# Patient Record
Sex: Male | Born: 1960 | Hispanic: Yes | Marital: Single | Smoking: Never smoker
Health system: Southern US, Community
[De-identification: ages and names within clinical notes are randomized; demographics above are authoritative.]

---

## 2012-03-06 ENCOUNTER — Emergency Department: Payer: Self-pay | Admitting: Emergency Medicine

## 2012-03-06 LAB — URINALYSIS, COMPLETE
Bilirubin,UR: NEGATIVE
Glucose,UR: NEGATIVE mg/dL (ref 0–75)
Ketone: NEGATIVE
Leukocyte Esterase: NEGATIVE
Ph: 5 (ref 4.5–8.0)
Protein: NEGATIVE
Specific Gravity: 1.02 (ref 1.003–1.030)
Squamous Epithelial: <1
WBC UR: <1 /HPF (ref 0–5)

## 2012-03-18 ENCOUNTER — Emergency Department: Payer: Self-pay | Admitting: Emergency Medicine

## 2020-06-03 ENCOUNTER — Encounter: Payer: Self-pay | Admitting: Emergency Medicine

## 2020-06-03 ENCOUNTER — Other Ambulatory Visit: Payer: Self-pay

## 2020-06-03 ENCOUNTER — Emergency Department
Admission: EM | Admit: 2020-06-03 | Discharge: 2020-06-03 | Disposition: A | Discharge status: Home / Self Care | Payer: Worker's Compensation | Attending: Emergency Medicine | Admitting: Emergency Medicine

## 2020-06-03 ENCOUNTER — Emergency Department: Payer: Worker's Compensation

## 2020-06-03 DIAGNOSIS — Z23 Encounter for immunization: Secondary | ICD-10-CM | POA: Insufficient documentation

## 2020-06-03 DIAGNOSIS — S80811A Abrasion, right lower leg, initial encounter: Secondary | ICD-10-CM | POA: Insufficient documentation

## 2020-06-03 DIAGNOSIS — M25561 Pain in right knee: Secondary | ICD-10-CM | POA: Insufficient documentation

## 2020-06-03 DIAGNOSIS — Y990 Civilian activity done for income or pay: Secondary | ICD-10-CM | POA: Insufficient documentation

## 2020-06-03 DIAGNOSIS — Y939 Activity, unspecified: Secondary | ICD-10-CM | POA: Insufficient documentation

## 2020-06-03 DIAGNOSIS — Y929 Unspecified place or not applicable: Secondary | ICD-10-CM | POA: Insufficient documentation

## 2020-06-03 DIAGNOSIS — W228XXA Striking against or struck by other objects, initial encounter: Secondary | ICD-10-CM | POA: Insufficient documentation

## 2020-06-03 DIAGNOSIS — T07XXXA Unspecified multiple injuries, initial encounter: Secondary | ICD-10-CM

## 2020-06-03 MED ORDER — MELOXICAM 15 MG PO TABS: 15.0000 mg | ORAL_TABLET | Freq: Every day | ORAL | 0 refills | Status: AC

## 2020-06-03 MED ORDER — TETANUS-DIPHTH-ACELL PERTUSSIS 5-2.5-18.5 LF-MCG/0.5 IM SUSY
0.5000 mL | PREFILLED_SYRINGE | Freq: Once | INTRAMUSCULAR | Status: AC
  Administered 2020-06-03: 0.5 mL via INTRAMUSCULAR
  Filled 2020-06-03: qty 0.5

## 2020-06-03 NOTE — ED Triage Notes (Addendum)
Triage assessment per face to face spanish interpreter Marchelle Folks.  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  Bleeding controlled at this time, laceration noted to right lower leg below knee. No bandage present at this time.  Pt works for [employer]. Supervisor is Wylene Men [phone]  Unknown when last tetanus shot was.

## 2020-06-03 NOTE — ED Notes (Signed)
Spanish interpreter [phone] utilized to review discharge instructions.

## 2020-06-03 NOTE — ED Provider Notes (Signed)
[facility]-EMERGENCY DEPARTMENT  ____________________________________________  Time seen: Approximately 10:08 PM  I have reviewed the triage vital signs and the nursing notes.   HISTORY  Chief Complaint Leg Injury (Work-related)   Historian Patient     HPI Stephen Rubio is a 60 y.o. male presents to the emergency department with deep abrasions along the right lower extremity along the inferior aspect of the right knee.  Patient reports that a door opened up against his knee while working.  No numbness or tingling in the bilateral lower extremities.  No similar symptoms in the past.   History reviewed. No pertinent past medical history.   Immunizations up to date:  Yes.     History reviewed. No pertinent past medical history.  There are no problems to display for this patient.   History reviewed. No pertinent surgical history.  Prior to Admission medications   Medication Sig Start Date End Date Taking? Authorizing Provider  meloxicam (MOBIC) 15 MG tablet Take 1 tablet (15 mg total) by mouth daily. 06/03/20 07/03/20 Yes Orvil Feil, PA-C    Allergies Patient has no known allergies.  History reviewed. No pertinent family history.  Social History Social History   Tobacco Use  . Smoking status: Never Smoker  . Smokeless tobacco: Never Used  Vaping Use  . Vaping Use: Never used  Substance Use Topics  . Alcohol use: Yes     Review of Systems  Constitutional: No fever/chills Eyes:  No discharge ENT: No upper respiratory complaints. Respiratory: no cough. No SOB/ use of accessory muscles to breath Gastrointestinal:   No nausea, no vomiting.  No diarrhea.  No constipation. Musculoskeletal: Patient has right knee pain.  Skin: Negative for rash, abrasions, lacerations, ecchymosis.    ____________________________________________   PHYSICAL EXAM:  VITAL SIGNS: ED Triage Vitals  Enc Vitals Group     BP 06/03/20 2055 111/73     Pulse Rate 06/03/20  2055 64     Resp 06/03/20 2055 16     Temp 06/03/20 2055 98.5 F (36.9 C)     Temp Source 06/03/20 2055 Oral     SpO2 06/03/20 2055 94 %     Weight 06/03/20 2103 164 lb (74.4 kg)     Height 06/03/20 2103 5' 4.96" (1.65 m)     Head Circumference --      Peak Flow --      Pain Score 06/03/20 2103 10     Pain Loc --      Pain Edu? --      Excl. in GC? --      Constitutional: Alert and oriented. Well appearing and in no acute distress. Eyes: Conjunctivae are normal. PERRL. EOMI. Head: Atraumatic. ENT: Cardiovascular: Normal rate, regular rhythm. Normal S1 and S2.  Good peripheral circulation. Respiratory: Normal respiratory effort without tachypnea or retractions. Lungs CTAB. Good air entry to the bases with no decreased or absent breath sounds Gastrointestinal: Bowel sounds x 4 quadrants. Soft and nontender to palpation. No guarding or rigidity. No distention. Musculoskeletal: Full range of motion to all extremities. No obvious deformities noted Neurologic:  Normal for age. No gross focal neurologic deficits are appreciated.  Skin: Patient has deep abrasions along the anterior aspect of the right lower extremity along the inferior aspect of the right knee. Psychiatric: Mood and affect are normal for age. Speech and behavior are normal.   ____________________________________________   LABS (all labs ordered are listed, but only abnormal results are displayed)  Labs Reviewed - No  data to display ____________________________________________  EKG   ____________________________________________  RADIOLOGY Geraldo Pitter, personally viewed and evaluated these images (plain radiographs) as part of my medical decision making, as well as reviewing the written report by the radiologist.    DG Knee Complete 4 Views Right  Result Date: 06/03/2020 CLINICAL DATA:  Right knee pain, injury EXAM: RIGHT KNEE - COMPLETE 4+ VIEW COMPARISON:  None. FINDINGS: Frontal, bilateral oblique,  lateral views of the right knee are obtained. No fracture, subluxation, or dislocation. Joint spaces are well preserved. Subcutaneous edema within the anteromedial aspect of the right knee. No joint effusion. IMPRESSION: 1. Soft tissue edema anteromedial right knee. 2. No acute fracture. Electronically Signed   By: Sharlet Salina M.D.   On: 06/03/2020 22:37    ____________________________________________    PROCEDURES  Procedure(s) performed:     Procedures     Medications  Tdap (BOOSTRIX) injection 0.5 mL (0.5 mLs Intramuscular Given 06/03/20 2216)     ____________________________________________   INITIAL IMPRESSION / ASSESSMENT AND PLAN / ED COURSE  Pertinent labs & imaging results that were available during my care of the patient were reviewed by me and considered in my medical decision making (see chart for details).      Assessment and Plan: Knee pain: Abrasions  60-year-old male presents to the emergency department with knee pain and deep abrasions along the inferior aspect of the right knee.  No bony abnormality was visualized on x-ray.  Patient's wounds were irrigated and dressed in the emergency department.  He was discharged with meloxicam for pain and inflammation.  He was advised to follow-up with orthopedics as needed.  All patient questions were answered.    ____________________________________________  FINAL CLINICAL IMPRESSION(S) / ED DIAGNOSES  Final diagnoses:  Abrasions of multiple sites  Acute pain of right knee      NEW MEDICATIONS STARTED DURING THIS VISIT:  ED Discharge Orders         Ordered    meloxicam (MOBIC) 15 MG tablet  Daily        06/03/20 2305              This chart was dictated using voice recognition software/Dragon. Despite best efforts to proofread, errors can occur which can change the meaning. Any change was purely unintentional.     Orvil Feil, PA-C 06/04/20 0008    Minna Antis, MD 06/07/20  [phone]

## 2020-06-03 NOTE — Discharge Instructions (Signed)
Take meloxicam once daily for pain and inflammation. 

## 2020-06-03 NOTE — ED Provider Notes (Incomplete)
[facility]-EMERGENCY DEPARTMENT  ____________________________________________  Time seen: Approximately 10:08 PM  I have reviewed the triage vital signs and the nursing notes.   HISTORY  Chief Complaint Leg Injury (Work-related)   Historian Patient     HPI Stephen Rubio is a 60 y.o. male presents to the emergency department with deep abrasions along the right lower extremity along the inferior aspect of the right knee.  Patient reports that a door opened up against his knee while working.  No numbness or tingling in the bilateral lower extremities.  No similar symptoms in the past.   History reviewed. No pertinent past medical history.   Immunizations up to date:  Yes.     History reviewed. No pertinent past medical history.  There are no problems to display for this patient.   History reviewed. No pertinent surgical history.  Prior to Admission medications   Not on File    Allergies Patient has no known allergies.  History reviewed. No pertinent family history.  Social History Social History   Tobacco Use  . Smoking status: Never Smoker  . Smokeless tobacco: Never Used  Vaping Use  . Vaping Use: Never used  Substance Use Topics  . Alcohol use: Yes     Review of Systems  Constitutional: No fever/chills Eyes:  No discharge ENT: No upper respiratory complaints. Respiratory: no cough. No SOB/ use of accessory muscles to breath Gastrointestinal:   No nausea, no vomiting.  No diarrhea.  No constipation. Musculoskeletal: Patient has right knee pain.  Skin: Negative for rash, abrasions, lacerations, ecchymosis.    ____________________________________________   PHYSICAL EXAM:  VITAL SIGNS: ED Triage Vitals  Enc Vitals Group     BP 06/03/20 2055 111/73     Pulse Rate 06/03/20 2055 64     Resp 06/03/20 2055 16     Temp 06/03/20 2055 98.5 F (36.9 C)     Temp Source 06/03/20 2055 Oral     SpO2 06/03/20 2055 94 %     Weight 06/03/20 2103  164 lb (74.4 kg)     Height 06/03/20 2103 5' 4.96" (1.65 m)     Head Circumference --      Peak Flow --      Pain Score 06/03/20 2103 10     Pain Loc --      Pain Edu? --      Excl. in GC? --      Constitutional: Alert and oriented. Well appearing and in no acute distress. Eyes: Conjunctivae are normal. PERRL. EOMI. Head: Atraumatic. ENT: Cardiovascular: Normal rate, regular rhythm. Normal S1 and S2.  Good peripheral circulation. Respiratory: Normal respiratory effort without tachypnea or retractions. Lungs CTAB. Good air entry to the bases with no decreased or absent breath sounds Gastrointestinal: Bowel sounds x 4 quadrants. Soft and nontender to palpation. No guarding or rigidity. No distention. Musculoskeletal: Full range of motion to all extremities. No obvious deformities noted Neurologic:  Normal for age. No gross focal neurologic deficits are appreciated.  Skin: Patient has deep abrasions along the anterior aspect of the right lower extremity along the inferior aspect of the right knee. Psychiatric: Mood and affect are normal for age. Speech and behavior are normal.   ____________________________________________   LABS (all labs ordered are listed, but only abnormal results are displayed)  Labs Reviewed - No data to display ____________________________________________  EKG   ____________________________________________  RADIOLOGY Geraldo Pitter, personally viewed and evaluated these images (plain radiographs) as part of my medical  decision making, as well as reviewing the written report by the radiologist.    DG Knee Complete 4 Views Right  Result Date: 06/03/2020 CLINICAL DATA:  Right knee pain, injury EXAM: RIGHT KNEE - COMPLETE 4+ VIEW COMPARISON:  None. FINDINGS: Frontal, bilateral oblique, lateral views of the right knee are obtained. No fracture, subluxation, or dislocation. Joint spaces are well preserved. Subcutaneous edema within the anteromedial aspect  of the right knee. No joint effusion. IMPRESSION: 1. Soft tissue edema anteromedial right knee. 2. No acute fracture. Electronically Signed   By: Sharlet Salina M.D.   On: 06/03/2020 22:37    ____________________________________________    PROCEDURES  Procedure(s) performed:     Procedures     Medications  Tdap (BOOSTRIX) injection 0.5 mL (0.5 mLs Intramuscular Given 06/03/20 2216)     ____________________________________________   INITIAL IMPRESSION / ASSESSMENT AND PLAN / ED COURSE  Pertinent labs & imaging results that were available during my care of the patient were reviewed by me and considered in my medical decision making (see chart for details).      Assessment and Plan: Knee pain: Abrasions  60-year-old male presents to the emergency department with knee pain and deep abrasions along the inferior aspect of the right knee.  No bony abnormality was visualized on x-ray.  Patient's wounds were irrigated and dressed in the emergency department.  He was discharged with meloxicam for pain and inflammation.  He was advised to follow-up with orthopedics as needed.  All patient questions were answered.    ____________________________________________  FINAL CLINICAL IMPRESSION(S) / ED DIAGNOSES  Final diagnoses:  None      NEW MEDICATIONS STARTED DURING THIS VISIT:  ED Discharge Orders    None          This chart was dictated using voice recognition software/Dragon. Despite best efforts to proofread, errors can occur which can change the meaning. Any change was purely unintentional.

## 2021-06-24 ENCOUNTER — Emergency Department (HOSPITAL_COMMUNITY)
Admission: EM | Admit: 2021-06-24 | Discharge: 2021-06-24 | Disposition: A | Discharge status: Home / Self Care | Payer: Worker's Compensation | Attending: Emergency Medicine | Admitting: Emergency Medicine

## 2021-06-24 ENCOUNTER — Encounter (HOSPITAL_COMMUNITY): Payer: Self-pay

## 2021-06-24 ENCOUNTER — Other Ambulatory Visit: Payer: Self-pay

## 2021-06-24 DIAGNOSIS — S01511A Laceration without foreign body of lip, initial encounter: Secondary | ICD-10-CM | POA: Insufficient documentation

## 2021-06-24 DIAGNOSIS — W228XXA Striking against or struck by other objects, initial encounter: Secondary | ICD-10-CM | POA: Diagnosis not present

## 2021-06-24 DIAGNOSIS — Y990 Civilian activity done for income or pay: Secondary | ICD-10-CM | POA: Diagnosis not present

## 2021-06-24 DIAGNOSIS — S0993XA Unspecified injury of face, initial encounter: Secondary | ICD-10-CM | POA: Diagnosis present

## 2021-06-24 MED ORDER — LIDOCAINE-EPINEPHRINE (PF) 2 %-1:200000 IJ SOLN
INTRAMUSCULAR | Status: AC
  Filled 2021-06-24: qty 20

## 2021-06-24 NOTE — ED Triage Notes (Signed)
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

## 2021-06-24 NOTE — Discharge Instructions (Signed)
Take out the stitch in 5 days.  Tylenol or ibuprofen as needed for pain

## 2021-06-24 NOTE — ED Provider Notes (Signed)
[facility]-EMERGENCY DEPT Provider Note   CSN: [id number] Arrival date & time: 06/24/21  0944     History  Chief Complaint  Patient presents with   Mouth Injury    Constantino Cavion Faiola is a 61 y.o. male.  Patient is a 61 year old healthy male presenting today after an accident at work.  Patient was trying to get concrete out that was stuck and he was using a bar in the bar flipped back and hit him in the mouth.  He denies injury anywhere else and has a laceration to his right upper side of his lip.  He reports his teeth feel the same as usual.  The history is provided by the patient. The history is limited by a language barrier. A language interpreter was used.  Mouth Injury      Home Medications Prior to Admission medications   Not on File      Allergies    Patient has no known allergies.    Review of Systems   Review of Systems  Physical Exam Updated Vital Signs BP (!) 111/59 (BP Location: Left Arm)   Pulse (!) 53   Temp 98 F (36.7 C) (Oral)   Resp 16   Ht 5' 10.08" (1.78 m)   Wt 74.8 kg   SpO2 94%   BMI 23.62 kg/m  Physical Exam Vitals and nursing note reviewed.  HENT:     Head: Normocephalic.     Mouth/Throat:     Pharynx: Oropharynx is clear.      Comments: Dental decay present on the right upper lateral incisor and canine with minimal gum ecchymosis no lacerations or bleeding Cardiovascular:     Rate and Rhythm: Normal rate.  Pulmonary:     Effort: Pulmonary effort is normal.  Skin:    General: Skin is warm and dry.  Neurological:     Mental Status: He is alert. Mental status is at baseline.    ED Results / Procedures / Treatments   Labs (all labs ordered are listed, but only abnormal results are displayed) Labs Reviewed - No data to display  EKG None  Radiology No results found.  Procedures Procedures    LACERATION REPAIR Performed by: [facility] Authorized by: Gwyneth Sprout Consent: Verbal  consent obtained. Risks and benefits: risks, benefits and alternatives were discussed Consent given by: patient Patient identity confirmed: provided demographic data Prepped and Draped in normal sterile fashion Wound explored  Laceration Location: right upper lip  Laceration Length: 1cm  No Foreign Bodies seen or palpated  Anesthesia: local infiltration  Local anesthetic: lidocaine 1% with epinephrine  Anesthetic total: 1 ml  Irrigation method: syringe Amount of cleaning: standard  Skin closure: 5.0 vicryl rapide  Number of sutures: 1  Technique: horizontal mattress  Patient tolerance: Patient tolerated the procedure well with no immediate complications.   Medications Ordered in ED Medications  lidocaine-EPINEPHrine (XYLOCAINE W/EPI) 2 %-1:200000 (PF) injection (  Given by Other 06/24/21 1025)    ED Course/ Medical Decision Making/ A&P                           Medical Decision Making  Patient presenting today for laceration of the lip.  No dental injury at this time.  Lip repaired as above.  Tetanus Shot is UTD in 2022        Final Clinical Impression(s) / ED Diagnoses Final diagnoses:  Lip laceration, initial encounter    Rx /  DC Orders ED Discharge Orders     None         Gwyneth Sprout, MD 06/24/21 1038

## 2021-08-11 IMAGING — DX DG KNEE COMPLETE 4+V*R*
4 series · 4 of 4 positions shown · non-contrast
Comparison: None.

CLINICAL DATA: Right knee pain, injury

EXAM:
RIGHT KNEE - COMPLETE 4+ VIEW

[knee ap]
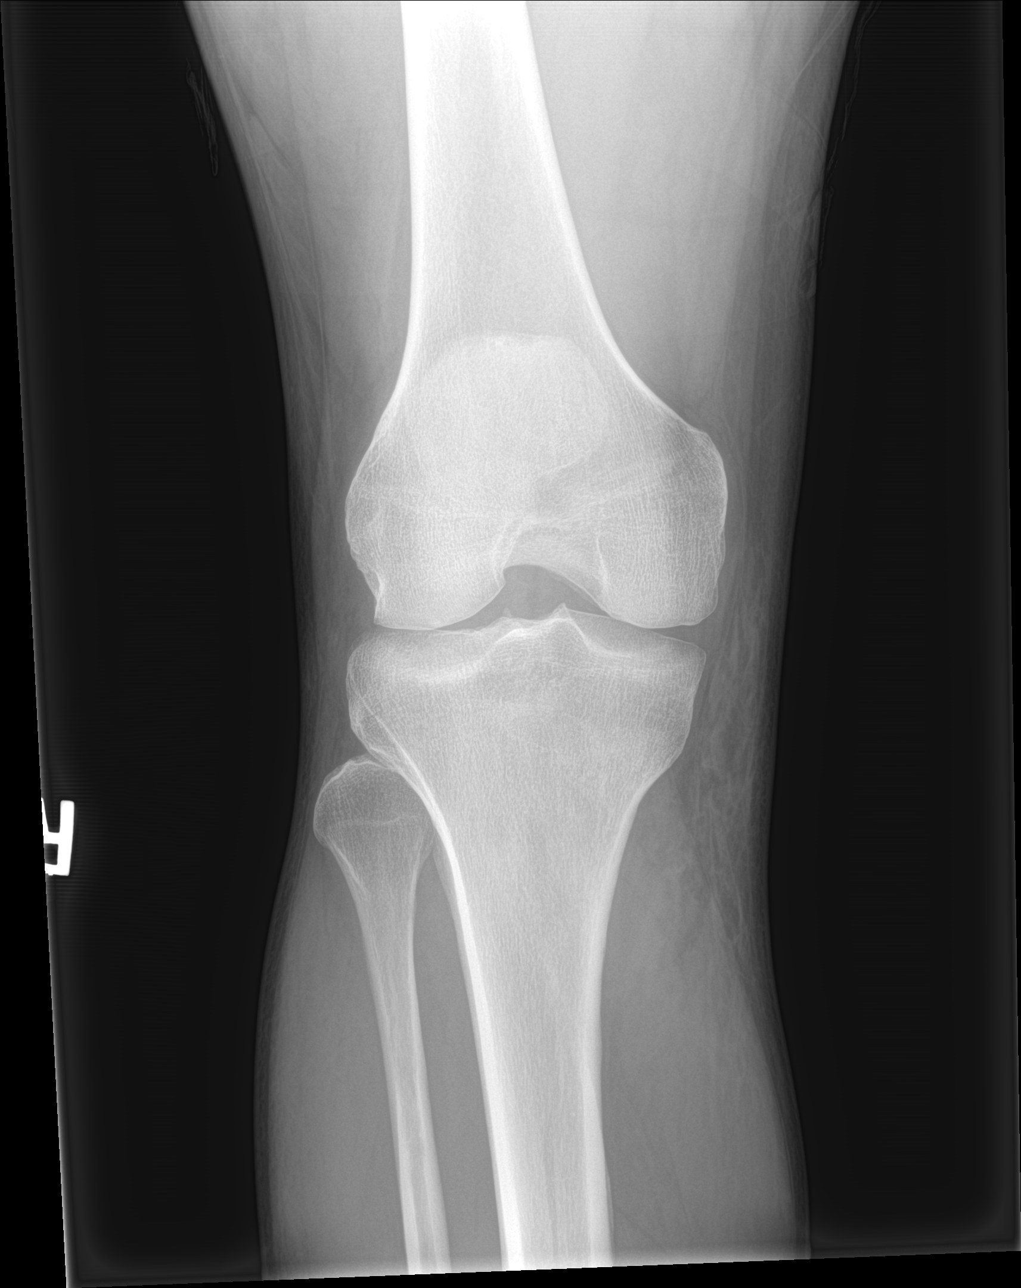

[knee tunnel]
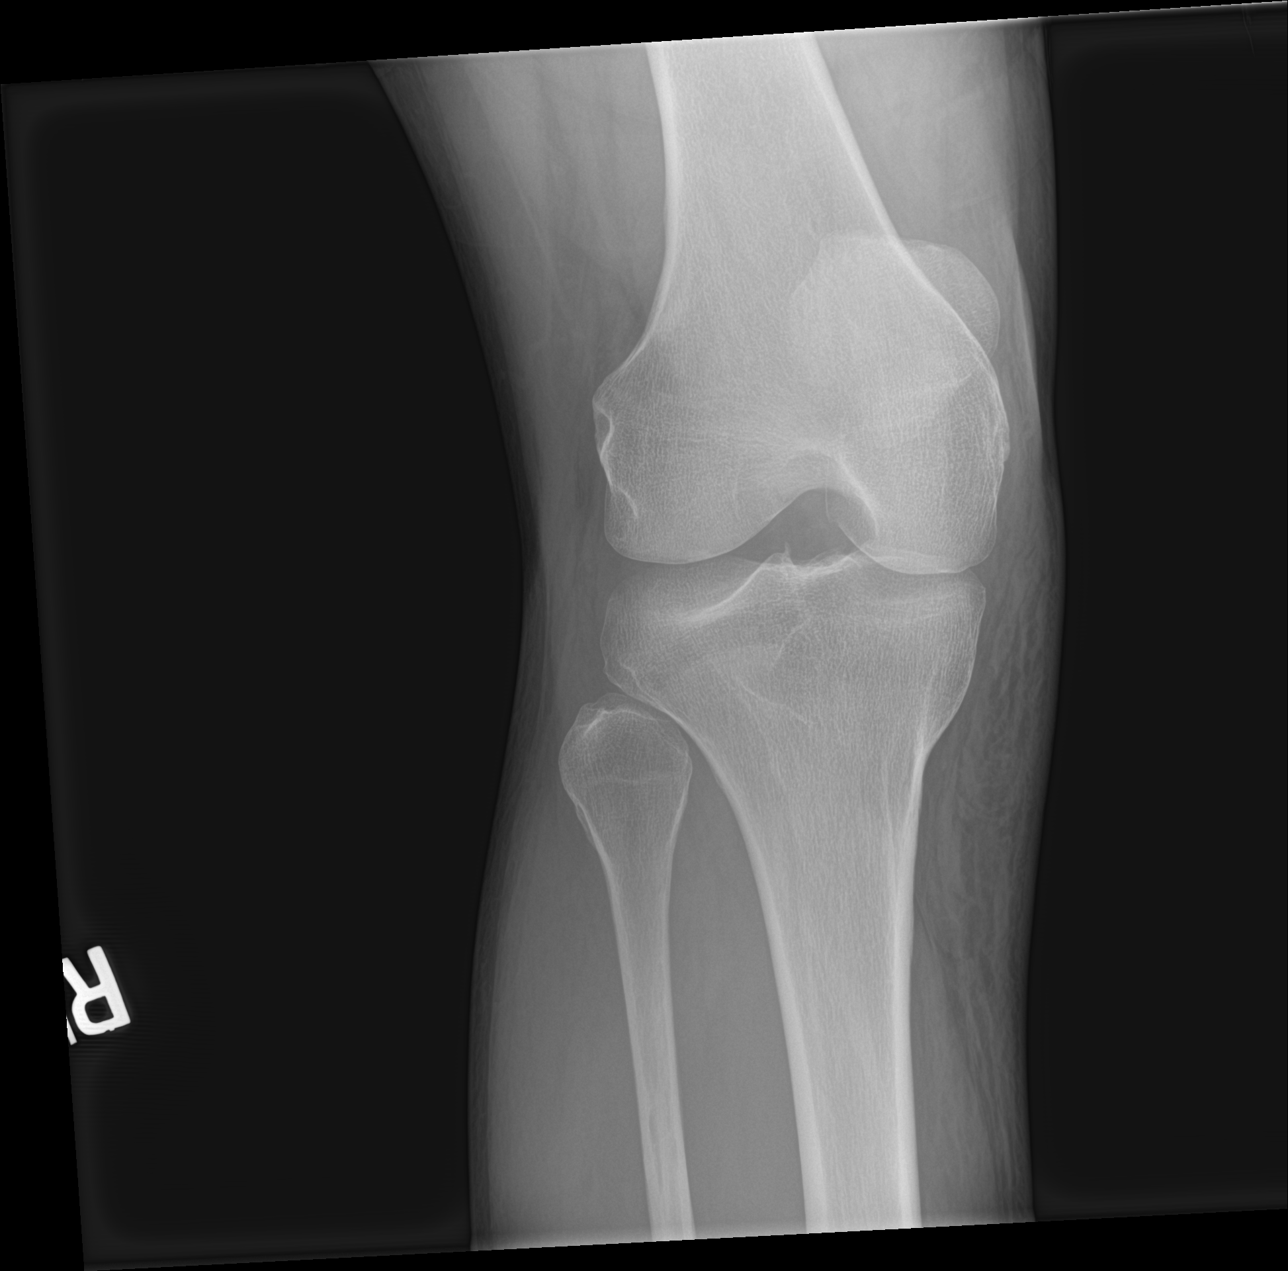

[knee lat]
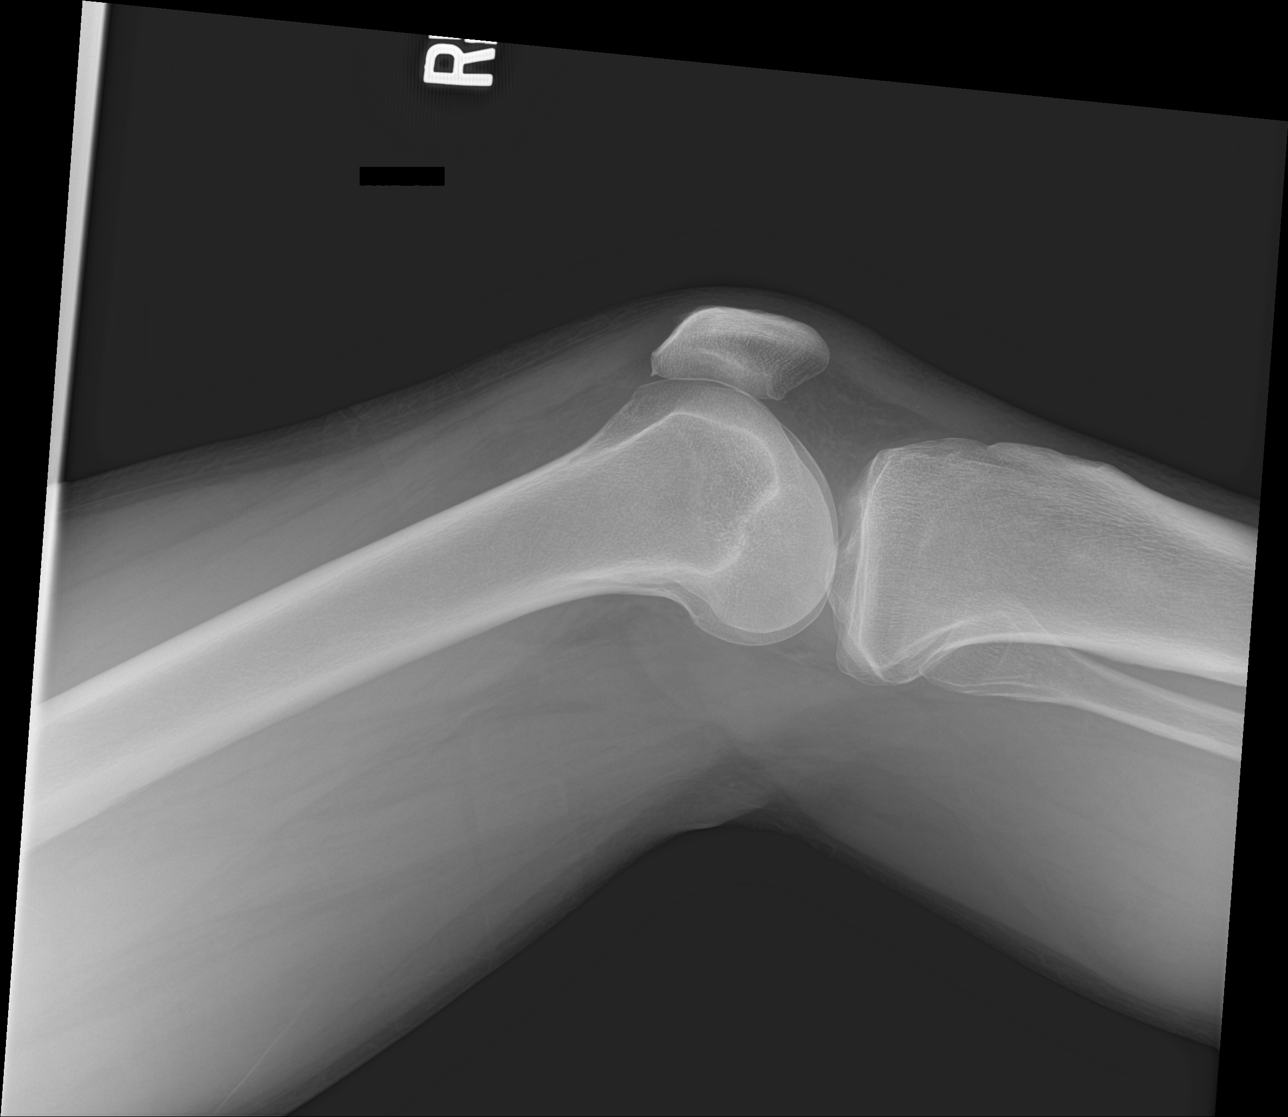

[knee obl]
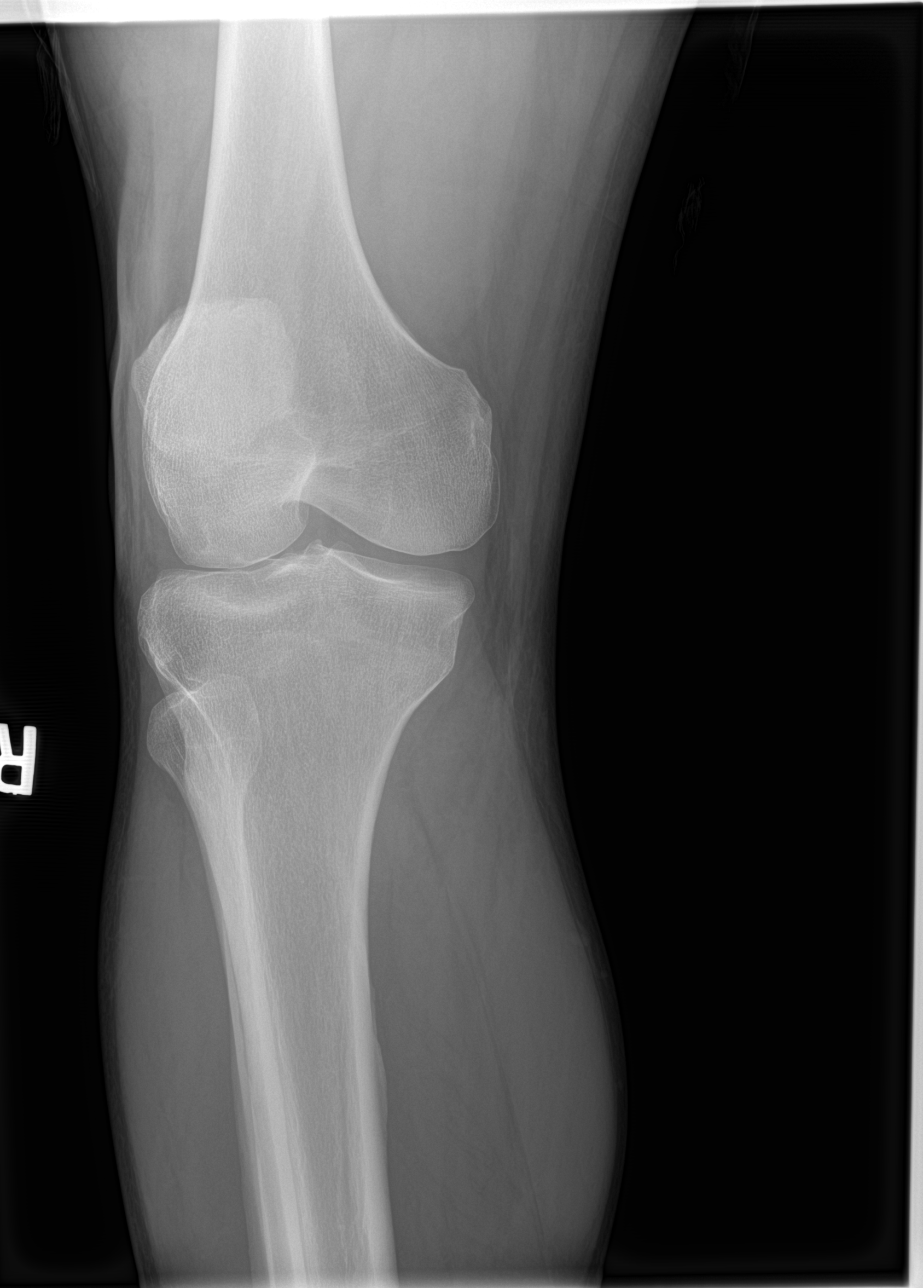

[4 of 4 positions shown; findings below may reference images not displayed]

FINDINGS: Frontal, bilateral oblique, lateral views of the right knee are
obtained. No fracture, subluxation, or dislocation. Joint spaces are
well preserved. Subcutaneous edema within the anteromedial aspect of
the right knee. No joint effusion.
IMPRESSION: 1. Soft tissue edema anteromedial right knee.
2. No acute fracture.

## 2021-10-18 ENCOUNTER — Other Ambulatory Visit: Payer: Self-pay | Admitting: Nurse Practitioner

## 2021-10-18 ENCOUNTER — Ambulatory Visit: Payer: Self-pay

## 2021-10-18 DIAGNOSIS — M79641 Pain in right hand: Secondary | ICD-10-CM

## 2023-12-07 ENCOUNTER — Emergency Department (HOSPITAL_COMMUNITY)
Admission: EM | Admit: 2023-12-07 | Discharge: 2023-12-07 | Disposition: A | Discharge status: Home / Self Care | Payer: Worker's Compensation | Attending: Emergency Medicine | Admitting: Emergency Medicine

## 2023-12-07 ENCOUNTER — Emergency Department (HOSPITAL_COMMUNITY): Payer: Worker's Compensation

## 2023-12-07 DIAGNOSIS — W270XXA Contact with workbench tool, initial encounter: Secondary | ICD-10-CM | POA: Insufficient documentation

## 2023-12-07 DIAGNOSIS — S61012A Laceration without foreign body of left thumb without damage to nail, initial encounter: Secondary | ICD-10-CM | POA: Insufficient documentation

## 2023-12-07 DIAGNOSIS — Z23 Encounter for immunization: Secondary | ICD-10-CM | POA: Insufficient documentation

## 2023-12-07 DIAGNOSIS — Y990 Civilian activity done for income or pay: Secondary | ICD-10-CM | POA: Insufficient documentation

## 2023-12-07 LAB — RAPID URINE DRUG SCREEN, HOSP PERFORMED
Amphetamines: NOT DETECTED
Barbiturates: NOT DETECTED
Benzodiazepines: NOT DETECTED
Cocaine: NOT DETECTED
Opiates: NOT DETECTED
Tetrahydrocannabinol: NOT DETECTED

## 2023-12-07 MED ORDER — OXYCODONE-ACETAMINOPHEN 5-325 MG PO TABS
2.0000 | ORAL_TABLET | Freq: Once | ORAL | Status: AC
  Administered 2023-12-07: 2 via ORAL
  Filled 2023-12-07: qty 2

## 2023-12-07 MED ORDER — OXYCODONE-ACETAMINOPHEN 5-325 MG PO TABS: 1.0000 | ORAL_TABLET | Freq: Four times a day (QID) | ORAL | 0 refills | Status: AC | PRN

## 2023-12-07 MED ORDER — CEPHALEXIN 250 MG PO CAPS
500.0000 mg | ORAL_CAPSULE | Freq: Once | ORAL | Status: AC
  Administered 2023-12-07: 500 mg via ORAL
  Filled 2023-12-07: qty 2

## 2023-12-07 MED ORDER — CEPHALEXIN 500 MG PO CAPS: 500.0000 mg | ORAL_CAPSULE | Freq: Two times a day (BID) | ORAL | 0 refills | Status: AC

## 2023-12-07 MED ORDER — ONDANSETRON HCL 4 MG/2ML IJ SOLN: 4.0000 mg | Freq: Once | INTRAMUSCULAR | Status: DC

## 2023-12-07 MED ORDER — LIDOCAINE HCL 2 % IJ SOLN
10.0000 mL | Freq: Once | INTRAMUSCULAR | Status: AC
  Administered 2023-12-07: 200 mg
  Filled 2023-12-07: qty 20

## 2023-12-07 MED ORDER — MORPHINE SULFATE (PF) 4 MG/ML IV SOLN: 4.0000 mg | Freq: Once | INTRAVENOUS | Status: DC

## 2023-12-07 MED ORDER — TETANUS-DIPHTH-ACELL PERTUSSIS 5-2-15.5 LF-MCG/0.5 IM SUSP
0.5000 mL | Freq: Once | INTRAMUSCULAR | Status: AC
  Administered 2023-12-07: 0.5 mL via INTRAMUSCULAR
  Filled 2023-12-07: qty 0.5

## 2023-12-07 NOTE — Discharge Instructions (Addendum)
 As we discussed, you have 5 sutures placed.  The sutures need to be removed in 5 to 7 days  Please change the dressing in 2 days  I have prescribed Keflex 500 mg twice daily for 5 days to prevent infection  Take Tylenol or Motrin for pain and Percocet for severe pain  Please follow-up with your doctor.  As we mentioned you need suture removal in 5 to 7 days  Return to ER if you have purulent discharge or severe pain

## 2023-12-07 NOTE — ED Provider Notes (Signed)
 [place] EMERGENCY DEPARTMENT AT [facility] Provider Note   CSN: [id number] Arrival date & time: 12/07/23  1707     Patient presents with: No chief complaint on file.   Stephen Rubio is a 63 y.o. male here presenting with left thumb injury.  Patient was at work and was trying to fix a saw.  He states that this saw was stuck and he used his left hand to fix it and then he started moving and he noticed a laceration of the thumb.  He does not remember last tetanus shot was.  Patient has no drug allergy   The history is provided by the patient.       Prior to Admission medications   Not on File    Allergies: Patient has no known allergies.    Review of Systems  Skin:  Positive for wound.  All other systems reviewed and are negative.   Updated Vital Signs BP 138/75   Pulse 62   Temp 98.5 F (36.9 C) (Oral)   Resp 18   SpO2 97%   Physical Exam Vitals and nursing note reviewed.  HENT:     Head: Normocephalic.     Nose: Nose normal.     Mouth/Throat:     Mouth: Mucous membranes are moist.  Eyes:     Pupils: Pupils are equal, round, and reactive to light.  Cardiovascular:     Rate and Rhythm: Normal rate.     Pulses: Normal pulses.  Pulmonary:     Effort: Pulmonary effort is normal.  Abdominal:     General: Abdomen is flat.  Musculoskeletal:     Cervical back: Normal range of motion.     Comments: Patient has a 5 cm laceration of the medial aspect of the left proximal thumb.  Patient has normal capillary refill.  Patient is able to flex and extend the thumb.  No obvious bone exposed  Skin:    General: Skin is warm.  Neurological:     General: No focal deficit present.     Mental Status: He is alert and oriented to person, place, and time.  Psychiatric:        Mood and Affect: Mood normal.        Behavior: Behavior normal.     (all labs ordered are listed, but only abnormal results are displayed) Labs Reviewed  RAPID URINE DRUG  SCREEN, HOSP PERFORMED    EKG: None  Radiology: DG Finger Thumb Left Result Date: 12/07/2023 EXAM: 3 VIEW(S) XRAY OF THE LEFT THUMB 12/07/2023 05:48:00 PM COMPARISON: 07/06/2016 report. CLINICAL HISTORY: Left thumb injury. FINDINGS: BONES AND JOINTS: No acute fracture. No focal osseous lesion. No joint dislocation. SOFT TISSUES: Mild soft tissue defect is suspected adjacent the proximal phalanx. IMPRESSION: 1. No acute fracture or dislocation. Electronically signed by: Stephen Kilts MD 12/07/2023 06:26 PM EST RP Workstation: [id number]     Procedures   LACERATION REPAIR Performed by: Stephen Rubio Authorized by: Stephen Rubio Consent: Verbal consent obtained. Risks and benefits: risks, benefits and alternatives were discussed Consent given by: patient Patient identity confirmed: provided demographic data Prepped and Draped in normal sterile fashion Wound explored  Laceration Location: L thumb   Laceration Length: 5 cm  No Foreign Bodies seen or palpated  Anesthesia: local infiltration  Local anesthetic: lidocaine  2% no epinephrine   Anesthetic total: 10 ml  Irrigation method: syringe Amount of cleaning: standard  Skin closure: 4-0 ethilon  Number of sutures: 5  Technique:  simple interrupted   Patient tolerance: Patient tolerated the procedure well with no immediate complications.   Medications Ordered in the ED  lidocaine  (XYLOCAINE ) 2 % (with pres) injection 200 mg (has no administration in time range)  oxyCODONE-acetaminophen (PERCOCET/ROXICET) 5-325 MG per tablet 2 tablet (2 tablets Oral Given 12/07/23 1807)  Tdap (ADACEL) injection 0.5 mL (0.5 mLs Intramuscular Given 12/07/23 1808)  cephALEXin (KEFLEX) capsule 500 mg (500 mg Oral Given 12/07/23 1807)                                    Medical Decision Making Stephen Rubio is a 63 y.o. male here presenting with left thumb laceration.  X-ray did not show any fracture.  I was able to put 5 stitches to  approximate the wound.  Tdap is updated.  Patient will be discharged home with Keflex to prevent infection    Problems Addressed: Laceration of left thumb without foreign body without damage to nail, initial encounter: acute illness or injury  Amount and/or Complexity of Data Reviewed Labs: ordered. Decision-making details documented in ED Course. Radiology: ordered.  Risk Prescription drug management.     Final diagnoses:  None    ED Discharge Orders     None          Stephen Stephen Macho, MD 12/07/23 [phone]

## 2023-12-07 NOTE — ED Triage Notes (Signed)
 Patient bib coworkers after he got a laceration on his left thumb. He was cut by a chop saw. Laceration is wrapped with a towel and taped up. He states that his finger is starting to go numb. Coworker states that the laceration is deep. Bleeding is controlled on arrival.
